# Patient Record
Sex: Female | Born: 2000 | Race: Black or African American | Hispanic: No | Marital: Single | Smoking: Never smoker
Health system: Southern US, Community
[De-identification: ages and names within clinical notes are randomized; demographics above are authoritative.]

## PROBLEM LIST (undated history)

## (undated) DIAGNOSIS — B1920 Unspecified viral hepatitis C without hepatic coma: Secondary | ICD-10-CM

## (undated) HISTORY — DX: Unspecified viral hepatitis C without hepatic coma: B19.20

---
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
## 2020-01-08 ENCOUNTER — Encounter: Payer: Self-pay | Admitting: Obstetrics and Gynecology

## 2020-01-08 ENCOUNTER — Other Ambulatory Visit (HOSPITAL_COMMUNITY)
Admission: RE | Admit: 2020-01-08 | Discharge: 2020-01-08 | Disposition: A | Discharge status: Home / Self Care | Payer: Medicaid Other | Source: Ambulatory Visit | Attending: Obstetrics and Gynecology | Admitting: Obstetrics and Gynecology

## 2020-01-08 ENCOUNTER — Ambulatory Visit (INDEPENDENT_AMBULATORY_CARE_PROVIDER_SITE_OTHER): Payer: Medicaid Other | Admitting: Obstetrics and Gynecology

## 2020-01-08 ENCOUNTER — Other Ambulatory Visit: Payer: Self-pay

## 2020-01-08 VITALS — BP 122/78 | HR 60 | Ht 62.0 in | Wt 117.0 lb

## 2020-01-08 DIAGNOSIS — Z01419 Encounter for gynecological examination (general) (routine) without abnormal findings: Secondary | ICD-10-CM | POA: Insufficient documentation

## 2020-01-08 DIAGNOSIS — Z30011 Encounter for initial prescription of contraceptive pills: Secondary | ICD-10-CM

## 2020-01-08 MED ORDER — LO LOESTRIN FE 1 MG-10 MCG / 10 MCG PO TABS: 1.0000 | ORAL_TABLET | Freq: Every day | ORAL | 4 refills | Status: DC

## 2020-01-08 NOTE — Progress Notes (Signed)
Pt states she is interested in low dose birth control.  Pt is sexually active, no other form of birth control. Pt states last cycle started 2 days ago, no bleeding today.

## 2020-01-08 NOTE — Progress Notes (Signed)
Subjective:     Brittany Galloway is a 19 y.o. female P0 with LMP 01/06/20 and BMI 21 who is here for a comprehensive physical exam. The patient reports no problems. She is sexually active using condoms occasionally. Patient discontinued depo-provera and desires a different form of contraception  History reviewed. No pertinent past medical history. History reviewed. No pertinent surgical history. Family History  Problem Relation Age of Onset  . Diabetes Maternal Grandfather   . Cancer Father      Social History   Socioeconomic History  . Marital status: Single    Spouse name: Not on file  . Number of children: Not on file  . Years of education: Not on file  . Highest education level: Not on file  Occupational History  . Not on file  Tobacco Use  . Smoking status: Never Smoker  . Smokeless tobacco: Never Used  Vaping Use  . Vaping Use: Never used  Substance and Sexual Activity  . Alcohol use: No  . Drug use: No  . Sexual activity: Yes    Partners: Male    Birth control/protection: None  Other Topics Concern  . Not on file  Social History Narrative  . Not on file   Social Determinants of Health   Financial Resource Strain:   . Difficulty of Paying Living Expenses:   Food Insecurity:   . Worried About Programme researcher, broadcasting/film/video in the Last Year:   . Barista in the Last Year:   Transportation Needs:   . Freight forwarder (Medical):   Marland Kitchen Lack of Transportation (Non-Medical):   Physical Activity:   . Days of Exercise per Week:   . Minutes of Exercise per Session:   Stress:   . Feeling of Stress :   Social Connections:   . Frequency of Communication with Friends and Family:   . Frequency of Social Gatherings with Friends and Family:   . Attends Religious Services:   . Active Member of Clubs or Organizations:   . Attends Banker Meetings:   Marland Kitchen Marital Status:   Intimate Partner Violence:   . Fear of Current or Ex-Partner:   . Emotionally Abused:    Marland Kitchen Physically Abused:   . Sexually Abused:    Health Maintenance  Topic Date Due  . TETANUS/TDAP  Never done  . CHLAMYDIA SCREENING  01/07/2020  . INFLUENZA VACCINE  01/26/2020  . Hepatitis C Screening  Completed  . HIV Screening  Completed       Review of Systems Pertinent items noted in HPI and remainder of comprehensive ROS otherwise negative.   Objective:  Blood pressure 122/78, pulse 60, height 5\' 2"  (1.575 m), weight 117 lb (53.1 kg), last menstrual period 01/06/2020.     GENERAL: Well-developed, well-nourished female in no acute distress.  HEENT: Normocephalic, atraumatic. Sclerae anicteric.  NECK: Supple. Normal thyroid.  LUNGS: Clear to auscultation bilaterally.  HEART: Regular rate and rhythm. BREASTS: Symmetric in size. No palpable masses or lymphadenopathy, skin changes, or nipple drainage. ABDOMEN: Soft, nontender, nondistended. No organomegaly. PELVIC: Normal external female genitalia. Vagina is pink and rugated.  Normal discharge. Normal appearing cervix. Uterus is normal in size. No adnexal mass or tenderness. EXTREMITIES: No cyanosis, clubbing, or edema, 2+ distal pulses.    Assessment:    Healthy female exam.      Plan:    STI screening collected Pap smear due at 70 Discussed contraception options and patient desires low dose birth control pill.  Rx Lo Loestrin provided Advised patient to use condoms for STI prevention with every sexual encounter See After Visit Summary for Counseling Recommendations

## 2020-01-09 LAB — HEPATITIS C ANTIBODY: Hep C Virus Ab: 1.1 s/co ratio — ABNORMAL HIGH (ref 0.0–0.9)

## 2020-01-09 LAB — HEPATITIS B SURFACE ANTIGEN: Hepatitis B Surface Ag: NEGATIVE

## 2020-01-09 LAB — RPR: RPR Ser Ql: NONREACTIVE

## 2020-01-09 LAB — HIV ANTIBODY (ROUTINE TESTING W REFLEX): HIV Screen 4th Generation wRfx: NONREACTIVE

## 2020-01-10 LAB — CERVICOVAGINAL ANCILLARY ONLY
Bacterial Vaginitis (gardnerella): POSITIVE — AB
Candida Glabrata: NEGATIVE
Candida Vaginitis: POSITIVE — AB
Chlamydia: NEGATIVE
Comment: NEGATIVE
Comment: NEGATIVE
Comment: NEGATIVE
Comment: NEGATIVE
Comment: NEGATIVE
Comment: NORMAL
Neisseria Gonorrhea: NEGATIVE
Trichomonas: NEGATIVE

## 2020-01-13 ENCOUNTER — Other Ambulatory Visit: Payer: Self-pay

## 2020-01-13 MED ORDER — METRONIDAZOLE 500 MG PO TABS: 500.0000 mg | ORAL_TABLET | Freq: Two times a day (BID) | ORAL | 0 refills | Status: DC

## 2020-01-13 MED ORDER — FLUCONAZOLE 150 MG PO TABS
150.0000 mg | ORAL_TABLET | Freq: Once | ORAL | 0 refills | Status: AC
Start: 2020-01-13 — End: 2020-01-13

## 2020-01-13 NOTE — Addendum Note (Signed)
Addended by: Catalina Antigua on: 01/13/2020 09:02 AM   Modules accepted: Orders

## 2020-01-26 DIAGNOSIS — Z419 Encounter for procedure for purposes other than remedying health state, unspecified: Secondary | ICD-10-CM | POA: Diagnosis not present

## 2020-01-28 ENCOUNTER — Encounter: Payer: Self-pay | Admitting: Obstetrics & Gynecology

## 2020-01-31 ENCOUNTER — Ambulatory Visit (INDEPENDENT_AMBULATORY_CARE_PROVIDER_SITE_OTHER): Payer: Medicaid Other

## 2020-01-31 ENCOUNTER — Other Ambulatory Visit (HOSPITAL_COMMUNITY)
Admission: RE | Admit: 2020-01-31 | Discharge: 2020-01-31 | Disposition: A | Discharge status: Home / Self Care | Payer: Medicaid Other | Source: Ambulatory Visit | Attending: Obstetrics and Gynecology | Admitting: Obstetrics and Gynecology

## 2020-01-31 ENCOUNTER — Other Ambulatory Visit: Payer: Self-pay

## 2020-01-31 VITALS — BP 126/76 | HR 63 | Wt 116.0 lb

## 2020-01-31 DIAGNOSIS — Z202 Contact with and (suspected) exposure to infections with a predominantly sexual mode of transmission: Secondary | ICD-10-CM

## 2020-01-31 NOTE — Progress Notes (Signed)
SUBJECTIVE:  19 y.o. female complains of exposure to STD Denies abnormal vaginal bleeding, discharge or significant pelvic pain or fever. No UTI symptoms. Patient's last menstrual period was 01/06/2020.  OBJECTIVE:  She appears well, afebrile. Urine dipstick: not done.  ASSESSMENT:  STD Exposure   PLAN:  GC, chlamydia, trichomonas, BVAG, CVAG probe sent to lab. Treatment: To be determined once lab results are received ROV prn if symptoms persist or worsen.

## 2020-02-02 LAB — CERVICOVAGINAL ANCILLARY ONLY
Bacterial Vaginitis (gardnerella): NEGATIVE
Candida Glabrata: NEGATIVE
Candida Vaginitis: NEGATIVE
Chlamydia: NEGATIVE
Comment: NEGATIVE
Comment: NEGATIVE
Comment: NEGATIVE
Comment: NEGATIVE
Comment: NEGATIVE
Comment: NORMAL
Neisseria Gonorrhea: NEGATIVE
Trichomonas: NEGATIVE

## 2020-02-26 DIAGNOSIS — Z419 Encounter for procedure for purposes other than remedying health state, unspecified: Secondary | ICD-10-CM | POA: Diagnosis not present

## 2020-03-27 DIAGNOSIS — Z419 Encounter for procedure for purposes other than remedying health state, unspecified: Secondary | ICD-10-CM | POA: Diagnosis not present

## 2020-04-27 DIAGNOSIS — Z419 Encounter for procedure for purposes other than remedying health state, unspecified: Secondary | ICD-10-CM | POA: Diagnosis not present

## 2020-05-27 DIAGNOSIS — Z419 Encounter for procedure for purposes other than remedying health state, unspecified: Secondary | ICD-10-CM | POA: Diagnosis not present

## 2020-06-27 DIAGNOSIS — Z419 Encounter for procedure for purposes other than remedying health state, unspecified: Secondary | ICD-10-CM | POA: Diagnosis not present

## 2020-07-28 DIAGNOSIS — Z419 Encounter for procedure for purposes other than remedying health state, unspecified: Secondary | ICD-10-CM | POA: Diagnosis not present

## 2020-08-25 DIAGNOSIS — Z419 Encounter for procedure for purposes other than remedying health state, unspecified: Secondary | ICD-10-CM | POA: Diagnosis not present

## 2020-09-25 DIAGNOSIS — Z419 Encounter for procedure for purposes other than remedying health state, unspecified: Secondary | ICD-10-CM | POA: Diagnosis not present

## 2020-10-25 DIAGNOSIS — Z419 Encounter for procedure for purposes other than remedying health state, unspecified: Secondary | ICD-10-CM | POA: Diagnosis not present

## 2020-11-25 DIAGNOSIS — Z419 Encounter for procedure for purposes other than remedying health state, unspecified: Secondary | ICD-10-CM | POA: Diagnosis not present

## 2020-12-25 DIAGNOSIS — Z419 Encounter for procedure for purposes other than remedying health state, unspecified: Secondary | ICD-10-CM | POA: Diagnosis not present

## 2021-01-18 ENCOUNTER — Ambulatory Visit: Payer: Medicaid Other | Admitting: Obstetrics and Gynecology

## 2021-01-25 DIAGNOSIS — Z419 Encounter for procedure for purposes other than remedying health state, unspecified: Secondary | ICD-10-CM | POA: Diagnosis not present

## 2021-02-23 ENCOUNTER — Ambulatory Visit: Payer: Medicaid Other | Admitting: Obstetrics and Gynecology

## 2021-02-25 DIAGNOSIS — Z419 Encounter for procedure for purposes other than remedying health state, unspecified: Secondary | ICD-10-CM | POA: Diagnosis not present

## 2021-03-27 DIAGNOSIS — Z419 Encounter for procedure for purposes other than remedying health state, unspecified: Secondary | ICD-10-CM | POA: Diagnosis not present

## 2021-04-08 ENCOUNTER — Ambulatory Visit: Payer: Medicaid Other

## 2021-04-27 DIAGNOSIS — Z419 Encounter for procedure for purposes other than remedying health state, unspecified: Secondary | ICD-10-CM | POA: Diagnosis not present

## 2021-05-04 ENCOUNTER — Ambulatory Visit (INDEPENDENT_AMBULATORY_CARE_PROVIDER_SITE_OTHER): Payer: Medicaid Other | Admitting: Advanced Practice Midwife

## 2021-05-04 ENCOUNTER — Other Ambulatory Visit (HOSPITAL_COMMUNITY)
Admission: RE | Admit: 2021-05-04 | Discharge: 2021-05-04 | Disposition: A | Discharge status: Home / Self Care | Payer: Medicaid Other | Source: Ambulatory Visit

## 2021-05-04 ENCOUNTER — Encounter: Payer: Self-pay | Admitting: Advanced Practice Midwife

## 2021-05-04 ENCOUNTER — Other Ambulatory Visit: Payer: Self-pay

## 2021-05-04 VITALS — BP 123/62 | HR 63 | Ht 62.0 in | Wt 122.2 lb

## 2021-05-04 DIAGNOSIS — Z0000 Encounter for general adult medical examination without abnormal findings: Secondary | ICD-10-CM

## 2021-05-04 DIAGNOSIS — Z113 Encounter for screening for infections with a predominantly sexual mode of transmission: Secondary | ICD-10-CM

## 2021-05-04 DIAGNOSIS — Z01419 Encounter for gynecological examination (general) (routine) without abnormal findings: Secondary | ICD-10-CM | POA: Diagnosis not present

## 2021-05-04 DIAGNOSIS — Z3009 Encounter for other general counseling and advice on contraception: Secondary | ICD-10-CM | POA: Diagnosis not present

## 2021-05-04 DIAGNOSIS — R768 Other specified abnormal immunological findings in serum: Secondary | ICD-10-CM | POA: Diagnosis not present

## 2021-05-04 NOTE — Progress Notes (Signed)
   Subjective:     Brittany Galloway is a 20 y.o. female here at [facility] for a routine exam.  Current complaints: none. She desires STI testing. She is intermittently sexually active and uses condoms and natural family planning for contraception.  Personal health questionnaire reviewed: yes.  Do you have a primary care provider? yes Do you feel safe at home? yes    Health Maintenance Due  Topic Date Due   HPV VACCINES (1 - 2-dose series) Never done   TETANUS/TDAP  Never done   INFLUENZA VACCINE  01/25/2021   CHLAMYDIA SCREENING  01/30/2021     Risk factors for chronic health problems: Smoking: Alchohol/how much: Pt BMI: There is no height or weight on file to calculate BMI.   Gynecologic History No LMP recorded. Contraception: condoms and natural family planning Last Pap: n/a.  Last mammogram: n/a.   Obstetric History OB History  Gravida Para Term Preterm AB Living  0 0 0 0 0 0  SAB IAB Ectopic Multiple Live Births  0 0 0 0 0     The following portions of the patient's history were reviewed and updated as appropriate: allergies, current medications, past family history, past medical history, past social history, past surgical history, and problem list.  Review of Systems Pertinent items noted in HPI and remainder of comprehensive ROS otherwise negative.    Objective:   There were no vitals taken for this visit. VS reviewed, nursing note reviewed,  Constitutional: well developed, well nourished, no distress HEENT: normocephalic CV: normal rate Pulm/chest wall: normal effort Breast Exam:  Deferred with low risks and shared decision making, discussed recommendation to start mammogram between 40-50 yo/ Abdomen: soft Neuro: alert and oriented x 3 Skin: warm, dry Psych: affect normal Pelvic exam: Deferred      Assessment/Plan:   1. Routine screening for STI (sexually transmitted infection)  - Cervicovaginal ancillary only - Hepatitis B surface antigen -  Hepatitis C antibody - RPR - HIV Antibody (routine testing w rflx)  2. Well woman exam (no gynecological exam)   3. Encounter for counseling regarding contraception --Discussed pt contraceptive plans and reviewed contraceptive methods based on pt preferences and effectiveness.  Pt prefers to continue NFP with condoms at this time.  Pt to check out www.bedsider.org for more contraceptive information.    Follow up in: 1  year  or as needed.   Sharen Counter, CNM 8:17 AM

## 2021-05-04 NOTE — Patient Instructions (Addendum)
Try www.bedsider.org for more information about contraception.

## 2021-05-05 LAB — HEPATITIS B SURFACE ANTIGEN: Hepatitis B Surface Ag: NEGATIVE

## 2021-05-05 LAB — CERVICOVAGINAL ANCILLARY ONLY
Chlamydia: NEGATIVE
Comment: NEGATIVE
Comment: NEGATIVE
Comment: NORMAL
Neisseria Gonorrhea: NEGATIVE
Trichomonas: NEGATIVE

## 2021-05-05 LAB — HIV ANTIBODY (ROUTINE TESTING W REFLEX): HIV Screen 4th Generation wRfx: NONREACTIVE

## 2021-05-05 LAB — HEPATITIS C ANTIBODY: Hep C Virus Ab: 1.2 s/co ratio — ABNORMAL HIGH (ref 0.0–0.9)

## 2021-05-05 LAB — RPR: RPR Ser Ql: NONREACTIVE

## 2021-05-08 ENCOUNTER — Encounter: Payer: Self-pay | Admitting: Advanced Practice Midwife

## 2021-05-08 DIAGNOSIS — R768 Other specified abnormal immunological findings in serum: Secondary | ICD-10-CM | POA: Insufficient documentation

## 2021-05-08 DIAGNOSIS — Z205 Contact with and (suspected) exposure to viral hepatitis: Secondary | ICD-10-CM | POA: Insufficient documentation

## 2021-05-08 NOTE — Addendum Note (Signed)
Addended by: Sharen Counter A on: 05/08/2021 02:06 AM   Modules accepted: Orders

## 2021-05-19 ENCOUNTER — Other Ambulatory Visit: Payer: Medicaid Other

## 2021-05-27 DIAGNOSIS — Z419 Encounter for procedure for purposes other than remedying health state, unspecified: Secondary | ICD-10-CM | POA: Diagnosis not present

## 2021-06-17 ENCOUNTER — Other Ambulatory Visit: Payer: Medicaid Other

## 2021-06-27 DIAGNOSIS — Z419 Encounter for procedure for purposes other than remedying health state, unspecified: Secondary | ICD-10-CM | POA: Diagnosis not present

## 2021-07-28 DIAGNOSIS — Z419 Encounter for procedure for purposes other than remedying health state, unspecified: Secondary | ICD-10-CM | POA: Diagnosis not present

## 2021-08-25 DIAGNOSIS — Z419 Encounter for procedure for purposes other than remedying health state, unspecified: Secondary | ICD-10-CM | POA: Diagnosis not present

## 2021-09-25 DIAGNOSIS — Z419 Encounter for procedure for purposes other than remedying health state, unspecified: Secondary | ICD-10-CM | POA: Diagnosis not present

## 2021-10-25 DIAGNOSIS — Z419 Encounter for procedure for purposes other than remedying health state, unspecified: Secondary | ICD-10-CM | POA: Diagnosis not present

## 2021-11-25 DIAGNOSIS — Z419 Encounter for procedure for purposes other than remedying health state, unspecified: Secondary | ICD-10-CM | POA: Diagnosis not present

## 2021-12-25 DIAGNOSIS — Z419 Encounter for procedure for purposes other than remedying health state, unspecified: Secondary | ICD-10-CM | POA: Diagnosis not present

## 2022-01-09 ENCOUNTER — Encounter (HOSPITAL_COMMUNITY): Payer: Self-pay

## 2022-01-09 ENCOUNTER — Ambulatory Visit (HOSPITAL_COMMUNITY)
Admission: EM | Admit: 2022-01-09 | Discharge: 2022-01-09 | Disposition: A | Discharge status: Home / Self Care | Payer: Medicaid Other | Attending: Emergency Medicine | Admitting: Emergency Medicine

## 2022-01-09 DIAGNOSIS — H10023 Other mucopurulent conjunctivitis, bilateral: Secondary | ICD-10-CM | POA: Diagnosis not present

## 2022-01-09 DIAGNOSIS — J069 Acute upper respiratory infection, unspecified: Secondary | ICD-10-CM

## 2022-01-09 DIAGNOSIS — J029 Acute pharyngitis, unspecified: Secondary | ICD-10-CM

## 2022-01-09 LAB — POCT RAPID STREP A, ED / UC: Streptococcus, Group A Screen (Direct): NEGATIVE

## 2022-01-09 MED ORDER — GENTAMICIN SULFATE 0.3 % OP SOLN: 2.0000 [drp] | Freq: Four times a day (QID) | OPHTHALMIC | 0 refills | Status: AC

## 2022-01-09 NOTE — Discharge Instructions (Addendum)
I recommend mucinex twice daily for congestion. You can try benadryl at night for sleep.  Use the eye drops as prescribed.  Warm salt water gargles, ibuprofen, throat lozenges for pain.  Please go to the emergency department if symptoms worsen.

## 2022-01-09 NOTE — ED Triage Notes (Signed)
Patient presents to Urgent Care with complaints of sore through, congestion, eye irritation and drainage since 3 days ago. Patient reports step mom is an eye doctor and she has been taking eye drops from her for allergies

## 2022-01-09 NOTE — ED Provider Notes (Signed)
[facility]    CSN: [id number] Arrival date & time: 01/09/22  1201     History   Chief Complaint Chief Complaint  Patient presents with   eye irritation   Sore Throat    HPI Brittany Galloway is a 21 y.o. female.  Presents with 3-day history of sore throat, nasal congestion, eye irritation and drainage.  Sore throat 4/10.  Reports originally right eye was red and irritated but now has spread to left eye.  Some green drainage in the mornings. Been using allergy eyedrops. Has tried TheraFlu.  Has not tried decongestants. Denies fever, chills, cough, shortness of breath, abdominal pain, vomiting/diarrhea, rash.  No known sick contacts.  Past Medical History:  Diagnosis Date   Hepatitis C     Patient Active Problem List   Diagnosis Date Noted   Exposure to hepatitis C 05/08/2021   Hepatitis C antibody positive in blood 05/08/2021    History reviewed. No pertinent surgical history.  OB History     Gravida  0   Para  0   Term  0   Preterm  0   AB  0   Living  0      SAB  0   IAB  0   Ectopic  0   Multiple  0   Live Births  0            Home Medications    Prior to Admission medications   Medication Sig Start Date End Date Taking? Authorizing Provider  gentamicin (GARAMYCIN) 0.3 % ophthalmic solution Place 2 drops into both eyes in the morning, at noon, in the evening, and at bedtime. 01/09/22  Yes Deondrea Markos, Lurena Joiner, PA-C    Family History Family History  Problem Relation Age of Onset   Diabetes Maternal Grandfather    Cancer Father     Social History Social History   Tobacco Use   Smoking status: Never   Smokeless tobacco: Never  Vaping Use   Vaping Use: Never used  Substance Use Topics   Alcohol use: No   Drug use: No     Allergies   Patient has no known allergies.   Review of Systems Review of Systems Per HPI  Physical Exam Triage Vital Signs ED Triage Vitals  Enc Vitals Group     BP 01/09/22 1225 (!) 126/55      Pulse Rate 01/09/22 1225 61     Resp 01/09/22 1225 18     Temp 01/09/22 1225 98.4 F (36.9 C)     Temp src --      SpO2 01/09/22 1225 97 %     Weight --      Height --      Head Circumference --      Peak Flow --      Pain Score 01/09/22 1224 4     Pain Loc --      Pain Edu? --      Excl. in GC? --    No data found.  Updated Vital Signs BP (!) 126/55   Pulse 61   Temp 98.4 F (36.9 C)   Resp 18   LMP 12/25/2021 (Exact Date)   SpO2 97%   Visual Acuity Right Eye Distance:   Left Eye Distance:   Bilateral Distance:    Right Eye Near:   Left Eye Near:    Bilateral Near:     Physical Exam Vitals and nursing note reviewed.  Constitutional:  General: She is not in acute distress. HENT:     Right Ear: Tympanic membrane and ear canal normal.     Left Ear: Tympanic membrane and ear canal normal.     Nose: Congestion present.     Mouth/Throat:     Mouth: Mucous membranes are moist.     Pharynx: Uvula midline. Posterior oropharyngeal erythema present.     Tonsils: No tonsillar exudate or tonsillar abscesses.  Eyes:     General:        Right eye: No discharge.        Left eye: No discharge.     Extraocular Movements: Extraocular movements intact.     Conjunctiva/sclera:     Right eye: Right conjunctiva is injected.     Left eye: Left conjunctiva is injected.     Pupils: Pupils are equal, round, and reactive to light.     Comments: Mild swelling of left upper lid  Cardiovascular:     Rate and Rhythm: Normal rate and regular rhythm.     Heart sounds: Normal heart sounds.  Pulmonary:     Effort: Pulmonary effort is normal. No respiratory distress.     Breath sounds: Normal breath sounds. No wheezing.  Abdominal:     General: Bowel sounds are normal.     Palpations: Abdomen is soft.     Tenderness: There is no abdominal tenderness.  Musculoskeletal:     Cervical back: Normal range of motion.  Lymphadenopathy:     Cervical: No cervical adenopathy.   Neurological:     Mental Status: She is alert and oriented to person, place, and time.      UC Treatments / Results  Labs (all labs ordered are listed, but only abnormal results are displayed) Labs Reviewed  POCT RAPID STREP A, ED / UC    EKG  Radiology No results found.  Procedures Procedures   Medications Ordered in UC Medications - No data to display  Initial Impression / Assessment and Plan / UC Course  I have reviewed the triage vital signs and the nursing notes.  Pertinent labs & imaging results that were available during my care of the patient were reviewed by me and considered in my medical decision making (see chart for details).  Strep test negative.  Most likely viral URI with bilateral conjunctivitis. Symptomatic care at home for congestion and sore throat.  Gentamicin eyedrops for both eyes. Increase fluid intake.  Understands she may develop a cough due to her congestion.  Return precautions discussed, patient agrees to plan and she is discharged in stable condition.  Final Clinical Impressions(s) / UC Diagnoses   Final diagnoses:  Sore throat  Viral URI  Pink eye disease of both eyes     Discharge Instructions      I recommend mucinex twice daily for congestion. You can try benadryl at night for sleep.  Use the eye drops as prescribed.  Warm salt water gargles, ibuprofen, throat lozenges for pain.  Please go to the emergency department if symptoms worsen.     ED Prescriptions     Medication Sig Dispense Auth. Provider   gentamicin (GARAMYCIN) 0.3 % ophthalmic solution Place 2 drops into both eyes in the morning, at noon, in the evening, and at bedtime. 5 mL [place] Hair, Lurena Joiner, PA-C      PDMP not reviewed this encounter.   Matrice Herro, Lurena Joiner, PA-C 01/09/22 1309

## 2022-01-25 DIAGNOSIS — Z419 Encounter for procedure for purposes other than remedying health state, unspecified: Secondary | ICD-10-CM | POA: Diagnosis not present

## 2022-02-25 DIAGNOSIS — Z419 Encounter for procedure for purposes other than remedying health state, unspecified: Secondary | ICD-10-CM | POA: Diagnosis not present

## 2022-03-27 DIAGNOSIS — Z419 Encounter for procedure for purposes other than remedying health state, unspecified: Secondary | ICD-10-CM | POA: Diagnosis not present

## 2022-04-27 DIAGNOSIS — Z419 Encounter for procedure for purposes other than remedying health state, unspecified: Secondary | ICD-10-CM | POA: Diagnosis not present

## 2022-05-27 DIAGNOSIS — Z419 Encounter for procedure for purposes other than remedying health state, unspecified: Secondary | ICD-10-CM | POA: Diagnosis not present

## 2022-06-27 DIAGNOSIS — Z419 Encounter for procedure for purposes other than remedying health state, unspecified: Secondary | ICD-10-CM | POA: Diagnosis not present

## 2022-07-28 DIAGNOSIS — Z419 Encounter for procedure for purposes other than remedying health state, unspecified: Secondary | ICD-10-CM | POA: Diagnosis not present

## 2022-08-01 ENCOUNTER — Ambulatory Visit: Payer: Medicaid Other | Admitting: Obstetrics and Gynecology

## 2022-08-26 DIAGNOSIS — Z419 Encounter for procedure for purposes other than remedying health state, unspecified: Secondary | ICD-10-CM | POA: Diagnosis not present

## 2022-09-14 ENCOUNTER — Ambulatory Visit: Payer: Medicaid Other | Admitting: Obstetrics and Gynecology

## 2022-09-26 DIAGNOSIS — Z419 Encounter for procedure for purposes other than remedying health state, unspecified: Secondary | ICD-10-CM | POA: Diagnosis not present

## 2022-10-26 DIAGNOSIS — Z419 Encounter for procedure for purposes other than remedying health state, unspecified: Secondary | ICD-10-CM | POA: Diagnosis not present

## 2022-11-26 DIAGNOSIS — Z419 Encounter for procedure for purposes other than remedying health state, unspecified: Secondary | ICD-10-CM | POA: Diagnosis not present

## 2022-12-26 DIAGNOSIS — Z419 Encounter for procedure for purposes other than remedying health state, unspecified: Secondary | ICD-10-CM | POA: Diagnosis not present

## 2023-01-26 DIAGNOSIS — Z419 Encounter for procedure for purposes other than remedying health state, unspecified: Secondary | ICD-10-CM | POA: Diagnosis not present

## 2023-06-28 DIAGNOSIS — Z419 Encounter for procedure for purposes other than remedying health state, unspecified: Secondary | ICD-10-CM | POA: Diagnosis not present

## 2023-07-29 DIAGNOSIS — Z419 Encounter for procedure for purposes other than remedying health state, unspecified: Secondary | ICD-10-CM | POA: Diagnosis not present

## 2023-08-26 DIAGNOSIS — Z419 Encounter for procedure for purposes other than remedying health state, unspecified: Secondary | ICD-10-CM | POA: Diagnosis not present

## 2023-10-07 DIAGNOSIS — Z419 Encounter for procedure for purposes other than remedying health state, unspecified: Secondary | ICD-10-CM | POA: Diagnosis not present

## 2023-11-06 DIAGNOSIS — Z419 Encounter for procedure for purposes other than remedying health state, unspecified: Secondary | ICD-10-CM | POA: Diagnosis not present

## 2023-12-07 DIAGNOSIS — Z419 Encounter for procedure for purposes other than remedying health state, unspecified: Secondary | ICD-10-CM | POA: Diagnosis not present

## 2024-01-06 DIAGNOSIS — Z419 Encounter for procedure for purposes other than remedying health state, unspecified: Secondary | ICD-10-CM | POA: Diagnosis not present

## 2024-02-06 DIAGNOSIS — Z419 Encounter for procedure for purposes other than remedying health state, unspecified: Secondary | ICD-10-CM | POA: Diagnosis not present

## 2024-03-08 DIAGNOSIS — Z419 Encounter for procedure for purposes other than remedying health state, unspecified: Secondary | ICD-10-CM | POA: Diagnosis not present

## 2024-04-07 DIAGNOSIS — Z419 Encounter for procedure for purposes other than remedying health state, unspecified: Secondary | ICD-10-CM | POA: Diagnosis not present

## 2024-04-26 DIAGNOSIS — M79661 Pain in right lower leg: Secondary | ICD-10-CM | POA: Diagnosis not present

## 2024-05-27 DIAGNOSIS — M7651 Patellar tendinitis, right knee: Secondary | ICD-10-CM | POA: Diagnosis not present

## 2024-05-27 DIAGNOSIS — S86891A Other injury of other muscle(s) and tendon(s) at lower leg level, right leg, initial encounter: Secondary | ICD-10-CM | POA: Diagnosis not present

## 2024-05-27 DIAGNOSIS — M84361D Stress fracture, right tibia, subsequent encounter for fracture with routine healing: Secondary | ICD-10-CM | POA: Diagnosis not present
